# Patient Record
Sex: Male | Born: 1970 | Race: White | Hispanic: No | Marital: Married
Health system: Southern US, Community
[De-identification: ages and names within clinical notes are randomized; demographics above are authoritative.]

---

## 2004-04-29 ENCOUNTER — Ambulatory Visit (HOSPITAL_COMMUNITY): Admission: RE | Admit: 2004-04-29 | Discharge: 2004-04-29 | Payer: Self-pay | Admitting: Family Medicine

## 2006-11-24 ENCOUNTER — Encounter: Admission: RE | Admit: 2006-11-24 | Discharge: 2006-11-24 | Payer: Self-pay | Admitting: Family Medicine

## 2006-12-13 ENCOUNTER — Encounter: Admission: RE | Admit: 2006-12-13 | Discharge: 2006-12-13 | Payer: Self-pay | Admitting: Family Medicine

## 2007-01-10 ENCOUNTER — Encounter: Admission: RE | Admit: 2007-01-10 | Discharge: 2007-01-10 | Payer: Self-pay | Admitting: Family Medicine

## 2007-02-13 ENCOUNTER — Ambulatory Visit (HOSPITAL_COMMUNITY): Admission: RE | Admit: 2007-02-13 | Discharge: 2007-02-14 | Payer: Self-pay | Admitting: Neurosurgery

## 2007-04-17 ENCOUNTER — Encounter: Admission: RE | Admit: 2007-04-17 | Discharge: 2007-04-17 | Payer: Self-pay | Admitting: Neurosurgery

## 2010-05-04 ENCOUNTER — Ambulatory Visit (HOSPITAL_COMMUNITY): Admission: RE | Admit: 2010-05-04 | Discharge: 2010-05-04 | Payer: Self-pay | Admitting: General Surgery

## 2010-12-22 ENCOUNTER — Encounter
Admission: RE | Admit: 2010-12-22 | Discharge: 2010-12-22 | Payer: Self-pay | Source: Home / Self Care | Attending: Family Medicine | Admitting: Family Medicine

## 2011-01-02 ENCOUNTER — Encounter: Payer: Self-pay | Admitting: Family Medicine

## 2011-02-28 LAB — CBC
HCT: 43.7 % (ref 39.0–52.0)
Hemoglobin: 14.6 g/dL (ref 13.0–17.0)
MCHC: 33.5 g/dL (ref 30.0–36.0)
MCV: 84.5 fL (ref 78.0–100.0)
RBC: 5.17 MIL/uL (ref 4.22–5.81)
WBC: 6.3 10*3/uL (ref 4.0–10.5)

## 2011-02-28 LAB — DIFFERENTIAL
Basophils Relative: 0 % (ref 0–1)
Eosinophils Absolute: 0.1 10*3/uL (ref 0.0–0.7)
Lymphocytes Relative: 36 % (ref 12–46)
Monocytes Absolute: 0.5 10*3/uL (ref 0.1–1.0)
Neutro Abs: 3.4 10*3/uL (ref 1.7–7.7)
Neutrophils Relative %: 54 % (ref 43–77)

## 2011-02-28 LAB — BASIC METABOLIC PANEL
Chloride: 106 mEq/L (ref 96–112)
GFR calc Af Amer: >60 mL/min (ref >60)

## 2011-04-29 NOTE — Op Note (Signed)
[name], [name]               ACCOUNT NO.:  [id number]   MEDICAL RECORD NO.:  [id number]          PATIENT TYPE:  AMB   LOCATION:  SDS                          FACILITY:  [facility]   PHYSICIAN:  Danae Orleans. Venetia Maxon, M.D.  DATE OF BIRTH:  06-03-1971   DATE OF PROCEDURE:  02/13/2007  DATE OF DISCHARGE:                               OPERATIVE REPORT   PREOPERATIVE DIAGNOSIS:  Right L5-S1 herniated disk with spondylosis,  degenerative disk disease and radiculopathy.   POSTOPERATIVE DIAGNOSIS:  Right L5-S1 herniated disk with spondylosis,  degenerative disk disease and radiculopathy.   PROCEDURE:  Right L5-S1 microdiskectomy with microdissection.   SURGEON:  Danae Orleans. Venetia Maxon, M.D.   Threasa HeadsPhoebe Perch.   ANESTHESIA:  General endotracheal anesthesia.   ESTIMATED BLOOD LOSS:  Minimal.   COMPLICATIONS:  None.   DISPOSITION:  To recovery.   INDICATIONS:  The patient is a 40 year old young man with a large disk  herniation at L5-S1 on the right with right S1 radiculopathy.  It was  elected to take him to surgery for right L5-S1 microdiskectomy.   PROCEDURE:  The patient was brought to the operating room.  Following  satisfactory and uncomplicated induction of general endotracheal  anesthesia, placed under intravenous lines, the patient was turned to  the prone position on the Wilson frame.  His low back was then prepped  and draped in the usual sterile fashion.  The planned incision was  infiltrated with 0.25% Marcaine and 0.5% lidocaine with 1:200,000  epinephrine.  Incision was made in the midline, carried through copious  adipose tissue to the lumbodorsal fascia which incised on the right side  of midline.  Subperiosteal dissection was performed exposing the L5-S1  interspace.  Self-retaining retractor was placed and after confirmatory  x-ray was obtained a hemi semilaminectomy of L5 was performed with the  high-speed drill and completed with Kerrison rongeurs and a foraminotomy  overlying the S1 nerve root was also performed.  The ligamentum flavum  was detached and removed in piecemeal fashion.  The S1 nerve root was  identified and decompressed.  There was a large amount of herniated disk  material which was thinly contained by ligament by the annulus and this  was incised and multiple fragments of disk material were removed.  The  microscope was used and under microscopic visualization the S1 nerve  root was mobilized.  Interspace was incised and the remaining loose  material was removed both medially and laterally with resultant  significant decompression of the thecal sac and the S1 nerve root.  The  Epstein curette was used medially to remove a large fairly calcified  mass of disk material, but the remainder of the interspace was not  curetted.  Subsequently the interspace was irrigated.  There was no  evidence of residual loose disk material.  The S1 nerve root as well the  L5 nerve root appeared to be well decompressed.  Depo-Medrol and  fentanyl were then placed in the operative bed.  The self-retaining  retractor was removed.  The lumbodorsal fascia was closed with 0 Vicryl  sutures, subcutaneous tissues  reapproximated with 2-0 Vicryl interrupted  inverted sutures and the skin edges were approximated with interrupted 3-  0 Vicryl subcuticular stitch.  The wound was dressed with Dermabond.  The patient was extubated in the operating room and taken to the  recovery room in stable satisfactory condition having tolerated his  operation well.  Counts were correct at the end of the case.      Danae Orleans. Venetia Maxon, M.D.  Electronically Signed     JDS/MEDQ  D:  02/13/2007  T:  02/13/2007  Job:  [id number]

## 2014-04-02 ENCOUNTER — Ambulatory Visit
Admission: RE | Admit: 2014-04-02 | Discharge: 2014-04-02 | Disposition: A | Discharge status: Home / Self Care | Payer: No Typology Code available for payment source | Source: Ambulatory Visit | Attending: Family Medicine | Admitting: Family Medicine

## 2014-04-02 ENCOUNTER — Other Ambulatory Visit: Payer: Self-pay | Admitting: Family Medicine

## 2014-04-02 DIAGNOSIS — M898X3 Other specified disorders of bone, forearm: Secondary | ICD-10-CM

## 2021-11-12 ENCOUNTER — Other Ambulatory Visit: Payer: Self-pay | Admitting: Family Medicine

## 2021-11-12 DIAGNOSIS — Z8249 Family history of ischemic heart disease and other diseases of the circulatory system: Secondary | ICD-10-CM

## 2021-12-07 ENCOUNTER — Ambulatory Visit
Admission: RE | Admit: 2021-12-07 | Discharge: 2021-12-07 | Disposition: A | Discharge status: Home / Self Care | Payer: No Typology Code available for payment source | Source: Ambulatory Visit | Attending: Family Medicine | Admitting: Family Medicine

## 2021-12-07 DIAGNOSIS — Z8249 Family history of ischemic heart disease and other diseases of the circulatory system: Secondary | ICD-10-CM

## 2022-11-25 ENCOUNTER — Ambulatory Visit: Payer: Managed Care, Other (non HMO) | Admitting: Podiatry

## 2022-11-25 VITALS — BP 125/81 | HR 74

## 2022-11-25 DIAGNOSIS — M19072 Primary osteoarthritis, left ankle and foot: Secondary | ICD-10-CM

## 2022-11-25 DIAGNOSIS — M6740 Ganglion, unspecified site: Secondary | ICD-10-CM | POA: Diagnosis not present

## 2022-11-25 DIAGNOSIS — L723 Sebaceous cyst: Secondary | ICD-10-CM

## 2022-11-25 NOTE — Patient Instructions (Signed)

## 2022-11-25 NOTE — Progress Notes (Unsigned)
Subjective:   Patient ID: Mario Burton, male   DOB: 51 y.o.   MRN: [id number]   HPI Chief Complaint  Patient presents with   Cyst    Patient came in today for cyst on the left foot 3rd toe, which started 3 months ago,  patient denies any pain,     This started about 3-4 months ago. He thought it was a spider bite but then he tried to pop it. It was a thicker, pinker color.  It gets "annoying" and "irritated".    Review of Systems  All other systems reviewed and are negative.  No past medical history on file.  *** The histories are not reviewed yet. Please review them in the "History" navigator section and refresh this SmartLink.   Current Outpatient Medications:    naproxen (NAPROSYN) 500 MG tablet, Take 1 tablet by mouth 2 (two) times daily., Disp: , Rfl:    rosuvastatin (CRESTOR) 10 MG tablet, Take 1 tablet by mouth daily., Disp: , Rfl:   Not on File        Objective:  Physical Exam  ***     Assessment:  ***     Plan:  ***

## 2023-05-26 ENCOUNTER — Ambulatory Visit: Payer: Managed Care, Other (non HMO) | Admitting: Podiatry

## 2023-06-02 ENCOUNTER — Ambulatory Visit: Payer: Managed Care, Other (non HMO) | Admitting: Podiatry

## 2023-06-02 ENCOUNTER — Ambulatory Visit: Payer: Managed Care, Other (non HMO)

## 2023-06-02 DIAGNOSIS — M6740 Ganglion, unspecified site: Secondary | ICD-10-CM

## 2023-06-02 NOTE — Progress Notes (Unsigned)
  Subjective: Chief Complaint  Patient presents with   Ganglion Cyst    Patient presents today with left foot 3rd lateral toe cyst. Patient has had the cyst drained last year. Patient was playing baseball and noticed that the cyst had formed again. Aggravating factor playing baseball and having on cleats due to the tighter fit. No treatment     52 year old male presents the office today for follow-up evaluation of left third toe.  He states the cyst did come back but then it drained again.  This become a recurrent issue.  Saw him in December for the same issue.  Objective: AAO x3, NAD DP/PT pulses palpable bilaterally, CRT less than 3 seconds On the left third toe lateral aspect is where the cyst was present but there is no current fluid present in it.  He does get tenderness to the area but no significant pain today. No pain with calf compression, swelling, warmth, erythema  Assessment: Cyst left third toe  Plan: -All treatment options discussed with the patient including all alternatives, risks, complications.  -As this is becoming a recurrent issue we discussed surgical intervention in order to excise this.  We discussed DIPJ arthroplasty with soft tissue mass excision.  Prescription for procedure as well as postop course and wished to proceed with this.  Will plan to admit to the office under local anesthesia.  -Patient encouraged to call the office with any questions, concerns, change in symptoms.   Vivi Barrack DPM

## 2023-12-05 IMAGING — CT CT CARDIAC CORONARY ARTERY CALCIUM SCORE
3 series · 13 of 20 positions shown, 15 images · non-contrast
Comparison: None.

CLINICAL DATA: 50-year-old Caucasian male with history of
hyperlipidemia and family history of heart disease.

EXAM:
CT CARDIAC CORONARY ARTERY CALCIUM SCORE
TECHNIQUE: Non-contrast imaging through the heart was performed using
prospective ECG gating. Image post processing was performed on an
independent workstation, allowing for quantitative analysis of the
heart and coronary arteries. Note that this exam targets the heart
and the chest was not imaged in its entirety.

[Series 2: calcium scoring 2.00 qr36 bestdiast 69% hrt calciu · axial · 0.42mm/px · z∈[+1579,+1643]mm · 3 of 80 slices shown]
[im 16/80  vessel]
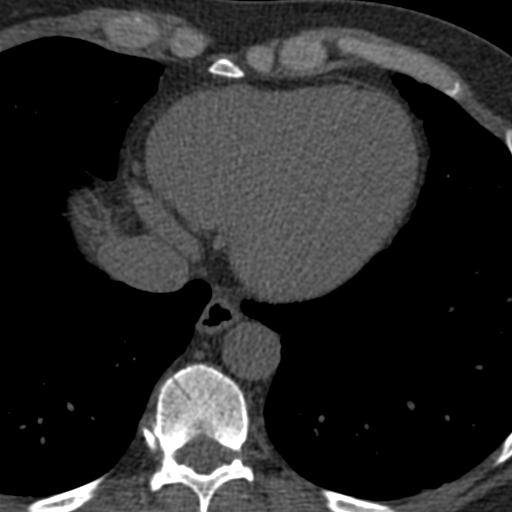
[im 32/80  vessel]
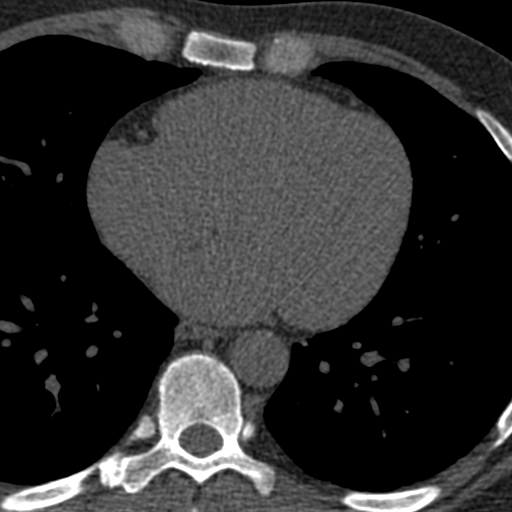
[im 48/80  vessel]
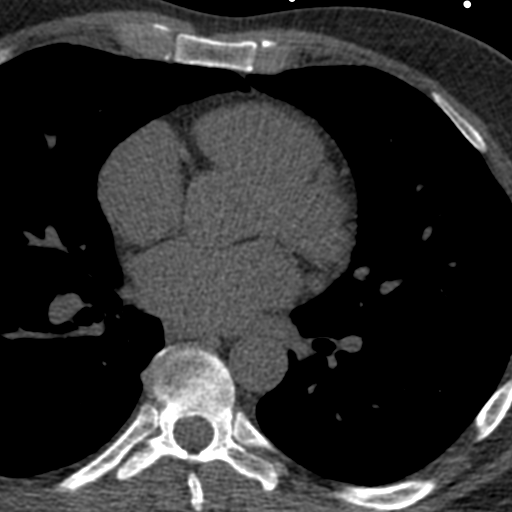

[Series 3: calcium scoring 2.00 br40 bestdiast 69% axial · axial · 0.54mm/px · z∈[+1575,+1679]mm · 5 of 80 slices shown, 7 images]
[im 14/80  vessel]
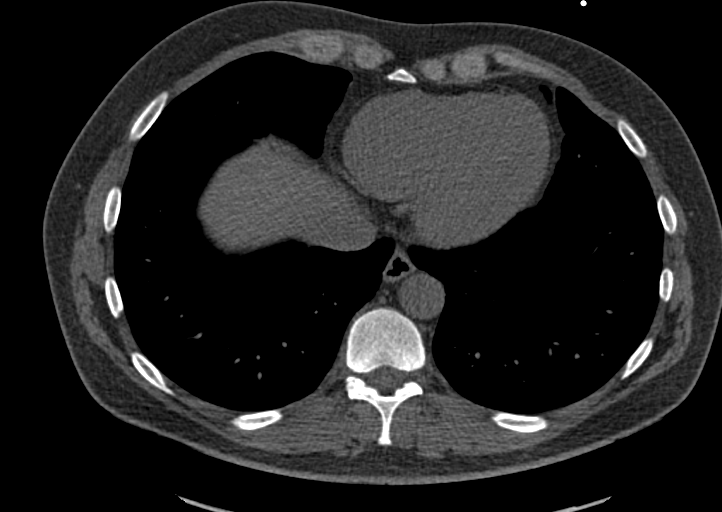
[im 14/80  lung]
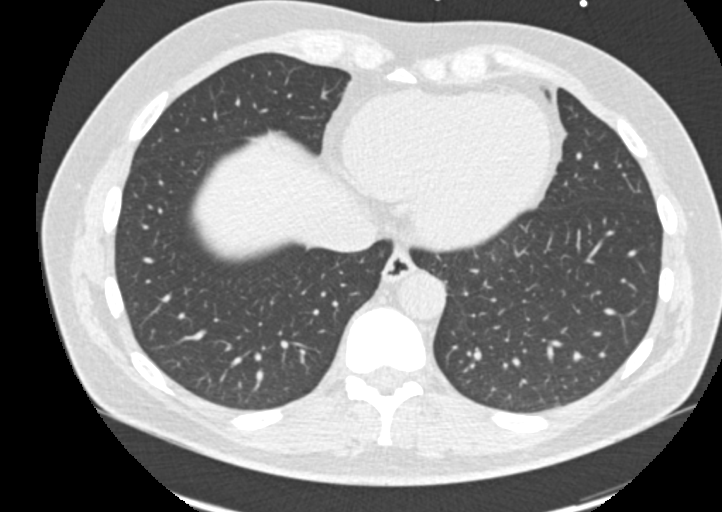
[im 27/80  vessel]
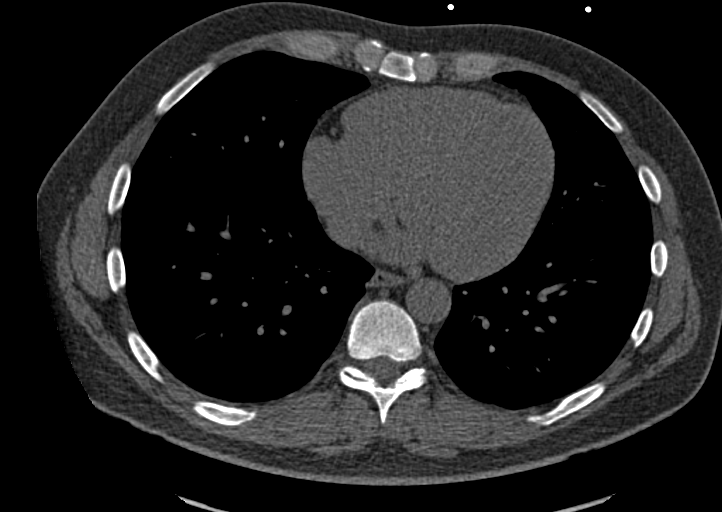
[im 40/80  vessel]
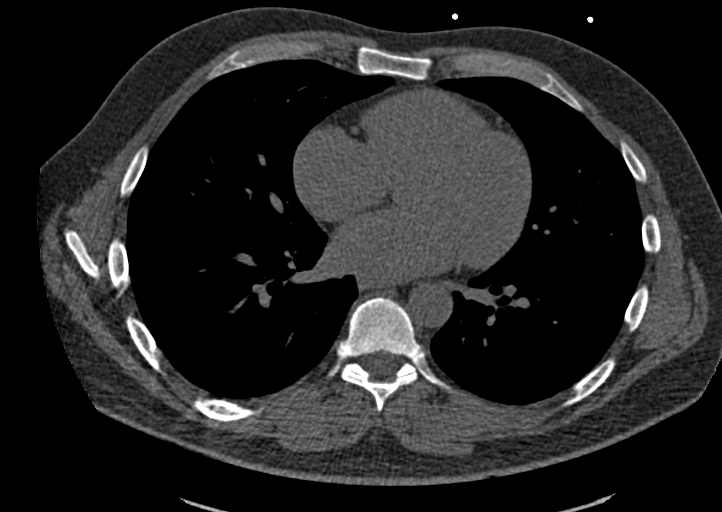
[im 53/80  vessel]
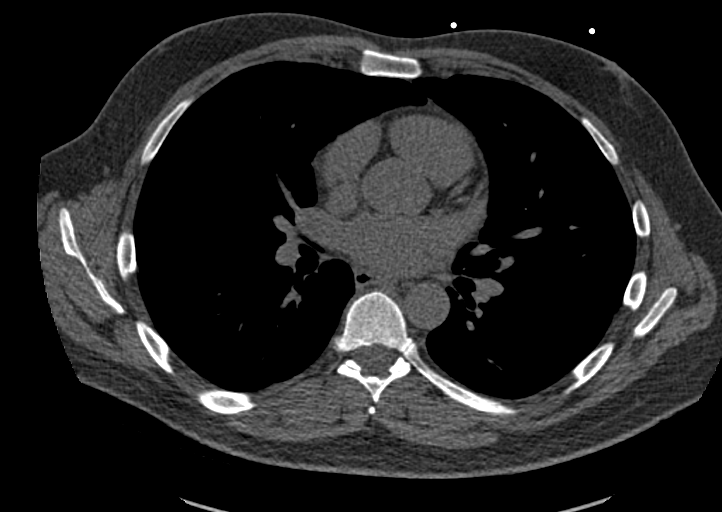
[im 66/80  vessel]
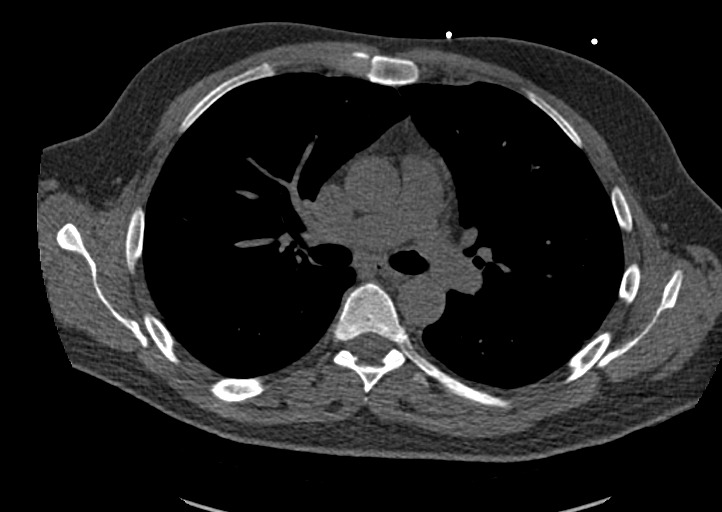
[im 66/80  lung]
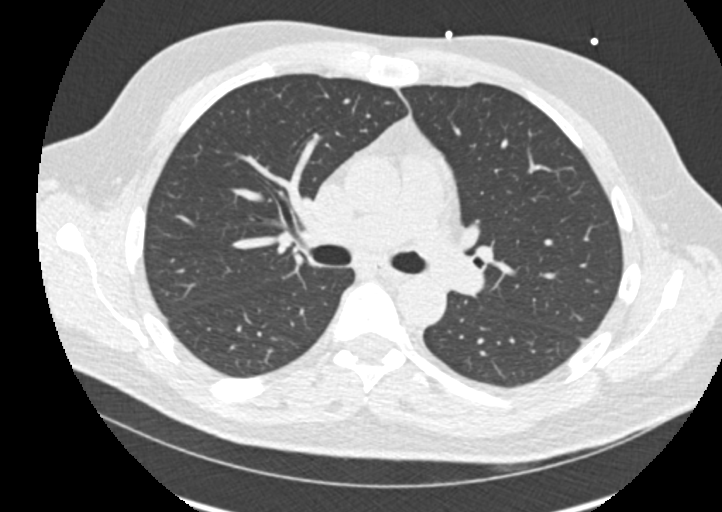

[Series 9: calcium scoring 2.00 br60 bestdiast 69% lungs · axial · 0.54mm/px · z∈[+1575,+1679]mm · 5 of 80 slices shown]
[im 14/80  vessel]
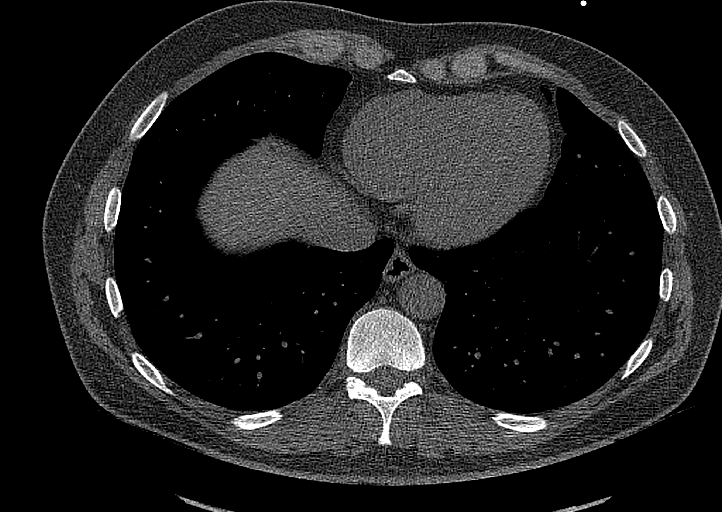
[im 27/80  vessel]
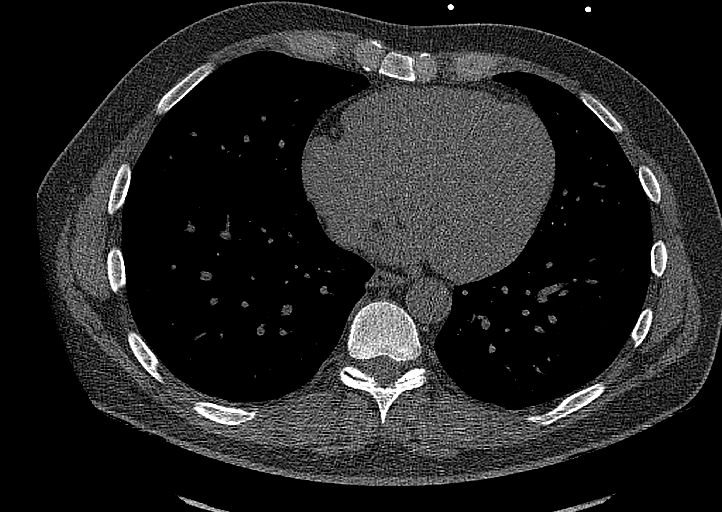
[im 40/80  vessel]
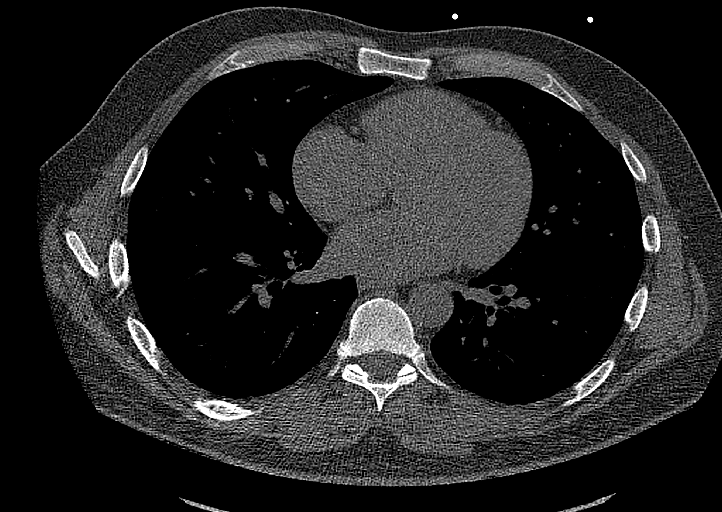
[im 53/80  vessel]
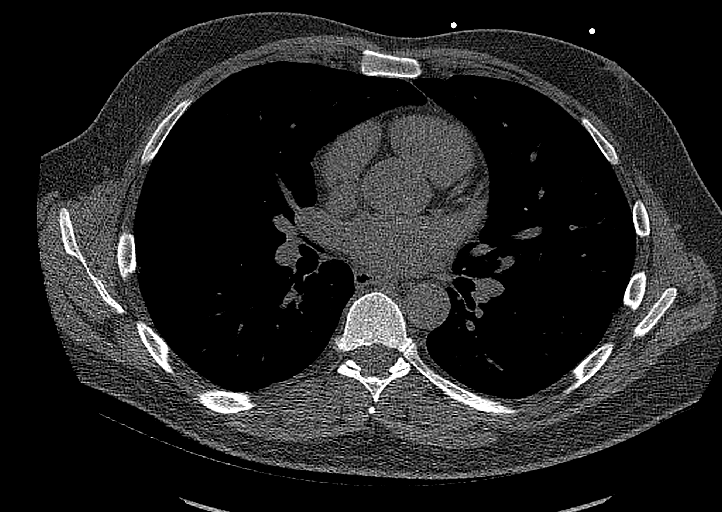
[im 66/80  vessel]
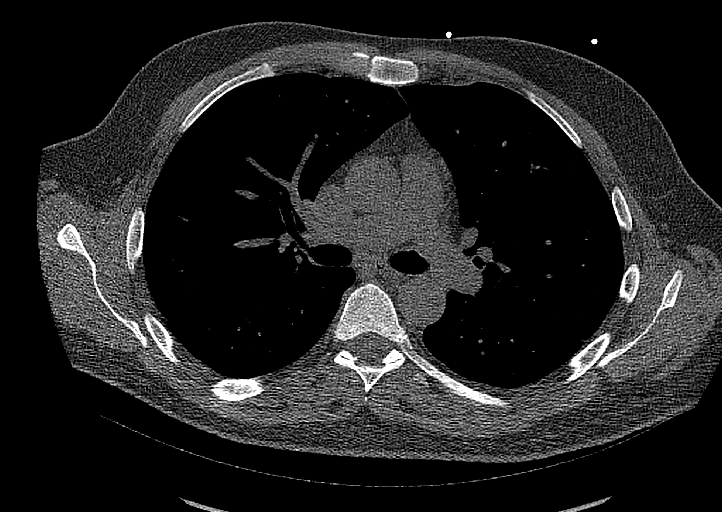

[13 of 20 positions shown; findings below may reference images not displayed]

FINDINGS: CORONARY CALCIUM SCORES:

Left Main: 0

LAD: 0.3. There are several small subtle foci that appeared slightly
dense in the LAD distribution. Only 1 of these areas registered as
having a small detectable calcium software.

LCx: 0

RCA: 0

Total Agatston Score:

[HOSPITAL] percentile: 61

AORTA MEASUREMENTS:

Ascending Aorta: 29 mm

Descending Aorta: 25 mm

OTHER FINDINGS:

The heart size is within normal limits. No pericardial fluid is
identified. Visualized segments of the thoracic aorta and central
pulmonary arteries are normal in caliber. Visualized mediastinum and
hilar regions demonstrate no enlarged lymph nodes or masses.
Visualized lungs show no evidence of pulmonary edema, consolidation,
pneumothorax, nodule or pleural fluid. Visualized upper abdomen and
bony structures are unremarkable.
IMPRESSION: Detection of single barely calcified plaque in the LAD distribution
resulting a low score 0.3. At the patient's age this is at the 61st
percentile for a Caucasian male.
